# Patient Record
Sex: Male | Born: 2000 | Race: White | Hispanic: No | Marital: Single | Smoking: Never smoker
Health system: Southern US, Community
[De-identification: ages and names within clinical notes are randomized; demographics above are authoritative.]

## PROBLEM LIST (undated history)

## (undated) DIAGNOSIS — K589 Irritable bowel syndrome without diarrhea: Secondary | ICD-10-CM

## (undated) DIAGNOSIS — G43909 Migraine, unspecified, not intractable, without status migrainosus: Secondary | ICD-10-CM

---
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
## 2019-11-27 ENCOUNTER — Other Ambulatory Visit: Payer: Self-pay

## 2019-11-27 ENCOUNTER — Ambulatory Visit: Payer: Self-pay

## 2019-11-27 ENCOUNTER — Other Ambulatory Visit: Payer: Self-pay | Admitting: Occupational Medicine

## 2019-11-27 DIAGNOSIS — M79644 Pain in right finger(s): Secondary | ICD-10-CM

## 2020-07-02 ENCOUNTER — Ambulatory Visit
Admission: EM | Admit: 2020-07-02 | Discharge: 2020-07-02 | Disposition: A | Discharge status: Home / Self Care | Payer: Medicaid Other | Attending: Emergency Medicine | Admitting: Emergency Medicine

## 2020-07-02 ENCOUNTER — Other Ambulatory Visit: Payer: Self-pay

## 2020-07-02 DIAGNOSIS — Z1152 Encounter for screening for COVID-19: Secondary | ICD-10-CM

## 2020-07-03 LAB — NOVEL CORONAVIRUS, NAA: SARS-CoV-2, NAA: NOT DETECTED

## 2020-07-12 ENCOUNTER — Encounter: Payer: Self-pay | Admitting: Emergency Medicine

## 2020-07-12 ENCOUNTER — Other Ambulatory Visit: Payer: Self-pay

## 2020-07-12 ENCOUNTER — Ambulatory Visit
Admission: EM | Admit: 2020-07-12 | Discharge: 2020-07-12 | Disposition: A | Discharge status: Home / Self Care | Payer: Medicaid Other | Attending: Emergency Medicine | Admitting: Emergency Medicine

## 2020-07-12 DIAGNOSIS — R05 Cough: Secondary | ICD-10-CM | POA: Diagnosis not present

## 2020-07-12 DIAGNOSIS — Z20822 Contact with and (suspected) exposure to covid-19: Secondary | ICD-10-CM

## 2020-07-12 DIAGNOSIS — R059 Cough, unspecified: Secondary | ICD-10-CM

## 2020-07-12 DIAGNOSIS — J069 Acute upper respiratory infection, unspecified: Secondary | ICD-10-CM

## 2020-07-12 MED ORDER — ONDANSETRON HCL 4 MG PO TABS: 4.0000 mg | ORAL_TABLET | Freq: Four times a day (QID) | ORAL | 0 refills | Status: DC

## 2020-07-12 MED ORDER — BENZONATATE 100 MG PO CAPS: 100.0000 mg | ORAL_CAPSULE | Freq: Three times a day (TID) | ORAL | 0 refills | Status: DC

## 2020-07-12 NOTE — ED Triage Notes (Signed)
Headache, woke up sweating, cough, nausea for past few days

## 2020-07-12 NOTE — ED Provider Notes (Signed)
[facility]   [id number] 07/12/20 Arrival Time: 1342   CC: COVID symptoms  SUBJECTIVE: History from: patient.  Jeremy Robertson is a 19 y.o. male who presents with headache, sweating, cough, diarrhea, and nausea x few days.  GF's brother tested positive for COVID.  Has tried OTC medications without relief.  Symptoms are made worse with eating.  Denies previous COVID infection in the past.   Denies fever, sore throat, SOB, wheezing, chest pain, vomiting, changes in bladder habits.    ROS: As per HPI.  All other pertinent ROS negative.     History reviewed. No pertinent past medical history. History reviewed. No pertinent surgical history. No Known Allergies No current facility-administered medications on file prior to encounter.   Current Outpatient Medications on File Prior to Encounter  Medication Sig Dispense Refill  . ibuprofen (ADVIL,MOTRIN) 600 MG tablet Take 1 tablet (600 mg total) by mouth every 8 (eight) hours as needed for moderate pain. 20 tablet 0   Social History   Socioeconomic History  . Marital status: Single    Spouse name: Not on file  . Number of children: Not on file  . Years of education: Not on file  . Highest education level: Not on file  Occupational History  . Not on file  Tobacco Use  . Smoking status: Never Smoker  . Smokeless tobacco: Never Used  Substance and Sexual Activity  . Alcohol use: No  . Drug use: No  . Sexual activity: Not on file  Other Topics Concern  . Not on file  Social History Narrative  . Not on file   Social Determinants of Health   Financial Resource Strain:   . Difficulty of Paying Living Expenses: Not on file  Food Insecurity:   . Worried About Programme researcher, broadcasting/film/video in the Last Year: Not on file  . Ran Out of Food in the Last Year: Not on file  Transportation Needs:   . Lack of Transportation (Medical): Not on file  . Lack of Transportation (Non-Medical): Not on file  Physical Activity:   . Days of  Exercise per Week: Not on file  . Minutes of Exercise per Session: Not on file  Stress:   . Feeling of Stress : Not on file  Social Connections:   . Frequency of Communication with Friends and Family: Not on file  . Frequency of Social Gatherings with Friends and Family: Not on file  . Attends Religious Services: Not on file  . Active Member of Clubs or Organizations: Not on file  . Attends Banker Meetings: Not on file  . Marital Status: Not on file  Intimate Partner Violence:   . Fear of Current or Ex-Partner: Not on file  . Emotionally Abused: Not on file  . Physically Abused: Not on file  . Sexually Abused: Not on file   No family history on file.  OBJECTIVE:  Vitals:   07/12/20 1419 07/12/20 1420  BP: 115/77   Pulse: 70   Resp: 18   Temp: 98.9 F (37.2 C)   TempSrc: Oral   SpO2: 97%   Weight:  145 lb (65.8 kg)  Height:  5\' 10"  (1.778 m)    General appearance: alert; appears fatigued, but nontoxic; speaking in full sentences and tolerating own secretions HEENT: NCAT; Ears: EACs clear, TMs pearly gray; Eyes: PERRL.  EOM grossly intact. Nose: nares patent without rhinorrhea, Throat: oropharynx clear, tonsils non erythematous or enlarged, uvula midline  Neck: supple without LAD  Lungs: unlabored respirations, symmetrical air entry; cough: absent; no respiratory distress; CTAB Heart: regular rate and rhythm.   Abdomen: soft, nondistended, normal active bowel sounds; nontender to palpation; no guarding  Skin: warm and dry Psychological: alert and cooperative; normal mood and affect  ASSESSMENT & PLAN:  1. Cough   2. Viral URI with cough   3. Suspected COVID-19 virus infection     Meds ordered this encounter  Medications  . benzonatate (TESSALON) 100 MG capsule    Sig: Take 1 capsule (100 mg total) by mouth every 8 (eight) hours.    Dispense:  21 capsule    Refill:  0    Order Specific Question:   Supervising Provider    Answer:   Eustace Moore  [[id number]]   COVID testing ordered.  It will take between 5-7 days for test results.  Someone will contact you regarding abnormal results.    In the meantime: You should remain isolated in your home for 10 days from symptom onset AND greater than 72 hours after symptoms resolution (absence of fever without the use of fever-reducing medication and improvement in respiratory symptoms), whichever is longer Get plenty of rest and push fluids Tessalon Perles prescribed for cough Use OTC zyrtec for nasal congestion, runny nose, and/or sore throat Use OTC flonase for nasal congestion and runny nose Use medications daily for symptom relief Use OTC medications like ibuprofen or tylenol as needed fever or pain Call or go to the ED if you have any new or worsening symptoms such as fever, worsening cough, shortness of breath, chest tightness, chest pain, turning blue, changes in mental status, etc...   Reviewed expectations re: course of current medical issues. Questions answered. Outlined signs and symptoms indicating need for more acute intervention. Patient verbalized understanding. After Visit Summary given.         Rennis Harding, PA-C 07/12/20 1457

## 2020-07-12 NOTE — Discharge Instructions (Signed)

## 2020-07-14 LAB — NOVEL CORONAVIRUS, NAA: SARS-CoV-2, NAA: DETECTED — AB

## 2020-07-14 LAB — SARS-COV-2, NAA 2 DAY TAT

## 2020-10-06 ENCOUNTER — Other Ambulatory Visit: Payer: Self-pay

## 2020-10-06 ENCOUNTER — Ambulatory Visit
Admission: EM | Admit: 2020-10-06 | Discharge: 2020-10-06 | Disposition: A | Discharge status: Home / Self Care | Payer: BLUE CROSS/BLUE SHIELD | Attending: Emergency Medicine | Admitting: Emergency Medicine

## 2020-10-06 DIAGNOSIS — J069 Acute upper respiratory infection, unspecified: Secondary | ICD-10-CM | POA: Insufficient documentation

## 2020-10-06 DIAGNOSIS — Z1152 Encounter for screening for COVID-19: Secondary | ICD-10-CM | POA: Diagnosis not present

## 2020-10-06 DIAGNOSIS — R509 Fever, unspecified: Secondary | ICD-10-CM | POA: Insufficient documentation

## 2020-10-06 DIAGNOSIS — J029 Acute pharyngitis, unspecified: Secondary | ICD-10-CM | POA: Diagnosis not present

## 2020-10-06 LAB — POCT RAPID STREP A (OFFICE): Rapid Strep A Screen: NEGATIVE

## 2020-10-06 MED ORDER — FLUTICASONE PROPIONATE 50 MCG/ACT NA SUSP: 1.0000 | Freq: Every day | NASAL | 0 refills | Status: AC

## 2020-10-06 MED ORDER — DEXAMETHASONE 4 MG PO TABS: 4.0000 mg | ORAL_TABLET | Freq: Every day | ORAL | 0 refills | Status: AC

## 2020-10-06 MED ORDER — BENZONATATE 100 MG PO CAPS: 100.0000 mg | ORAL_CAPSULE | Freq: Three times a day (TID) | ORAL | 0 refills | Status: DC | PRN

## 2020-10-06 MED ORDER — CETIRIZINE HCL 10 MG PO TABS: 10.0000 mg | ORAL_TABLET | Freq: Every day | ORAL | 0 refills | Status: DC

## 2020-10-06 NOTE — ED Triage Notes (Signed)
Pt presents with c/o fever , cough  And sore throat that began yesterday

## 2020-10-06 NOTE — Discharge Instructions (Addendum)
Strep test is negative.  Sample will be sent for culture and someone will call if your result is positive.  COVID-19, flu A/B, RSV testing ordered.  It will take between 2-7 days for test results.  Someone will contact you regarding abnormal results.    In the meantime: You should remain isolated in your home for 10 days from symptom onset AND greater than 24 hours after symptoms resolution (absence of fever without the use of fever-reducing medication and improvement in respiratory symptoms), whichever is longer Get plenty of rest and push fluids Tessalon Perles prescribed for cough Zyrtec for nasal congestion, runny nose, and/or sore throat Flonase for nasal congestion and runny nose Decadron was prescribed Use OTC throat lozenges such as Halls, Cepacol or Vicks to soothe throat Use medications daily for symptom relief Use OTC medications like ibuprofen or tylenol as needed fever or pain Call or go to the ED if you have any new or worsening symptoms such as fever, worsening cough, shortness of breath, chest tightness, chest pain, turning blue, changes in mental status, etc..Marland Kitchen

## 2020-10-06 NOTE — ED Provider Notes (Addendum)
[facility]   [id number] 10/06/20 Arrival Time: 1002   Chief Complaint  Patient presents with  . Sore Throat  . Fever  . Cough     SUBJECTIVE: History from: patient.  Aven Christen Zaugg is a 19 y.o. male who who presented to the urgent care for complaint of chills, fever, cough and sore throat that started yesterday.  Denies sick exposure to COVID, flu or strep.  Denies recent travel.  Has tried OTC medication without relief.  Denies alleviating or aggravating factors.  Denies previous symptoms in the past.   Denies fever, chills, fatigue, sinus pain, rhinorrhea, change in bowel and bladder habits.     ROS: As per HPI.  All other pertinent ROS negative.      History reviewed. No pertinent past medical history. History reviewed. No pertinent surgical history. No Known Allergies No current facility-administered medications on file prior to encounter.   No current outpatient medications on file prior to encounter.   Social History   Socioeconomic History  . Marital status: Single    Spouse name: Not on file  . Number of children: Not on file  . Years of education: Not on file  . Highest education level: Not on file  Occupational History  . Not on file  Tobacco Use  . Smoking status: Never Smoker  . Smokeless tobacco: Never Used  Vaping Use  . Vaping Use: Every day  Substance and Sexual Activity  . Alcohol use: Not Currently  . Drug use: Not Currently  . Sexual activity: Not on file  Other Topics Concern  . Not on file  Social History Narrative  . Not on file   Social Determinants of Health   Financial Resource Strain:   . Difficulty of Paying Living Expenses: Not on file  Food Insecurity:   . Worried About Programme researcher, broadcasting/film/video in the Last Year: Not on file  . Ran Out of Food in the Last Year: Not on file  Transportation Needs:   . Lack of Transportation (Medical): Not on file  . Lack of Transportation (Non-Medical): Not on file  Physical Activity:    . Days of Exercise per Week: Not on file  . Minutes of Exercise per Session: Not on file  Stress:   . Feeling of Stress : Not on file  Social Connections:   . Frequency of Communication with Friends and Family: Not on file  . Frequency of Social Gatherings with Friends and Family: Not on file  . Attends Religious Services: Not on file  . Active Member of Clubs or Organizations: Not on file  . Attends Banker Meetings: Not on file  . Marital Status: Not on file  Intimate Partner Violence:   . Fear of Current or Ex-Partner: Not on file  . Emotionally Abused: Not on file  . Physically Abused: Not on file  . Sexually Abused: Not on file   History reviewed. No pertinent family history.  OBJECTIVE:  Vitals:   10/06/20 1027  BP: 117/74  Pulse: 78  Resp: 18  Temp: 98.4 F (36.9 C)  SpO2: 99%     General appearance: alert; appears fatigued, but nontoxic; speaking in full sentences and tolerating own secretions HEENT: NCAT; Ears: EACs clear, TMs pearly gray; Eyes: PERRL.  EOM grossly intact. Sinuses: nontender; Nose: nares patent without rhinorrhea, Throat: oropharynx clear, tonsils non erythematous or enlarged, uvula midline  Neck: supple without LAD Lungs: unlabored respirations, symmetrical air entry; cough: moderate; no respiratory distress;  CTAB Heart: regular rate and rhythm.  Radial pulses 2+ symmetrical bilaterally Skin: warm and dry Psychological: alert and cooperative; normal mood and affect  LABS:  Results for orders placed or performed during the hospital encounter of 10/06/20 (from the past 24 hour(s))  POCT rapid strep A     Status: None   Collection Time: 10/06/20 10:49 AM  Result Value Ref Range   Rapid Strep A Screen Negative Negative     ASSESSMENT & PLAN:  1. Encounter for screening for COVID-19   2. Sore throat   3. URI with cough and congestion   4. Chills with fever     Meds ordered this encounter  Medications  . fluticasone  (FLONASE) 50 MCG/ACT nasal spray    Sig: Place 1 spray into both nostrils daily for 14 days.    Dispense:  16 g    Refill:  0  . cetirizine (ZYRTEC ALLERGY) 10 MG tablet    Sig: Take 1 tablet (10 mg total) by mouth daily.    Dispense:  30 tablet    Refill:  0  . dexamethasone (DECADRON) 4 MG tablet    Sig: Take 1 tablet (4 mg total) by mouth daily for 7 days.    Dispense:  7 tablet    Refill:  0  . benzonatate (TESSALON) 100 MG capsule    Sig: Take 1 capsule (100 mg total) by mouth 3 (three) times daily as needed for cough.    Dispense:  30 capsule    Refill:  0    Discharge instructions  Strep test is negative.  Sample will be sent for culture and someone will call if your result is positive.  COVID-19, flu A/B, RSV testing ordered.  It will take between 2-7 days for test results.  Someone will contact you regarding abnormal results.    In the meantime: You should remain isolated in your home for 10 days from symptom onset AND greater than 24 hours after symptoms resolution (absence of fever without the use of fever-reducing medication and improvement in respiratory symptoms), whichever is longer Get plenty of rest and push fluids Tessalon Perles prescribed for cough Zyrtec for nasal congestion, runny nose, and/or sore throat Flonase for nasal congestion and runny nose Decadron was prescribed Use OTC throat lozenges such as Halls, Cepacol or Vicks to soothe throat Use medications daily for symptom relief Use OTC medications like ibuprofen or tylenol as needed fever or pain Call or go to the ED if you have any new or worsening symptoms such as fever, worsening cough, shortness of breath, chest tightness, chest pain, turning blue, changes in mental status, etc...   Reviewed expectations re: course of current medical issues. Questions answered. Outlined signs and symptoms indicating need for more acute intervention. Patient verbalized understanding. After Visit Summary  given.         Durward Parcel, FNP 10/06/20 1053    Durward Parcel, FNP 10/06/20 1055

## 2020-10-08 LAB — COVID-19, FLU A+B AND RSV
Influenza A, NAA: DETECTED — AB
Influenza B, NAA: NOT DETECTED
RSV, NAA: NOT DETECTED
SARS-CoV-2, NAA: NOT DETECTED

## 2020-10-12 LAB — CULTURE, GROUP A STREP (THRC)

## 2021-06-28 IMAGING — DX DG FINGER THUMB 2+V*R*
3 series · 3 of 3 positions shown · non-contrast
Comparison: None.

CLINICAL DATA: Right thumb crush injury, initial encounter

EXAM:
RIGHT THUMB 2+V

[finger pa]
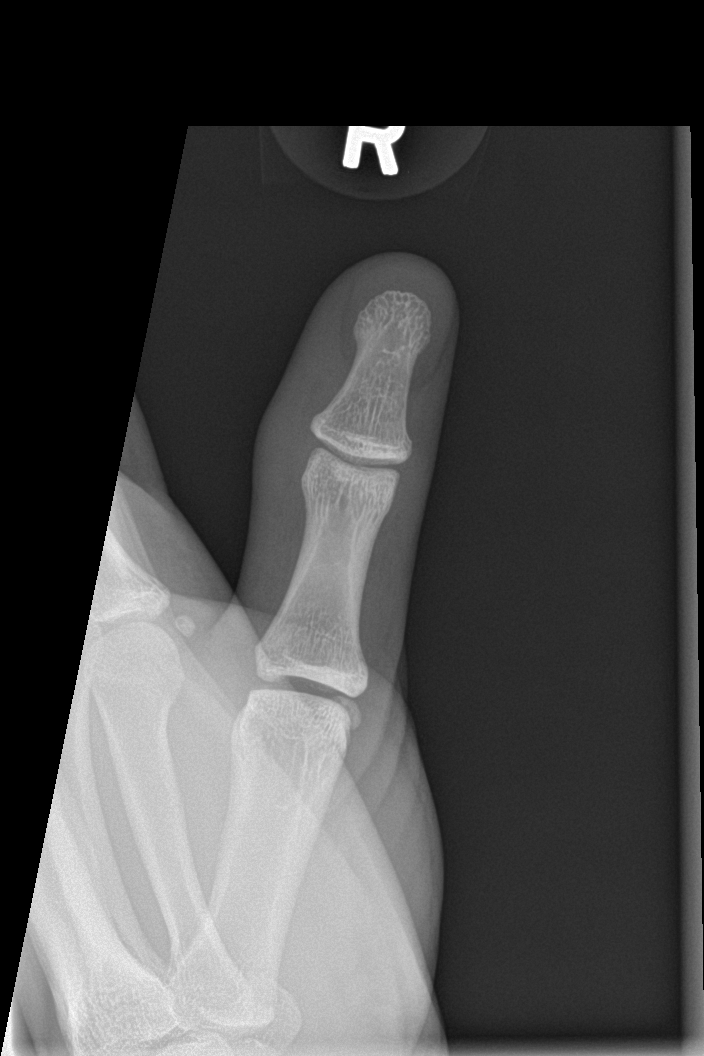

[finger obl]
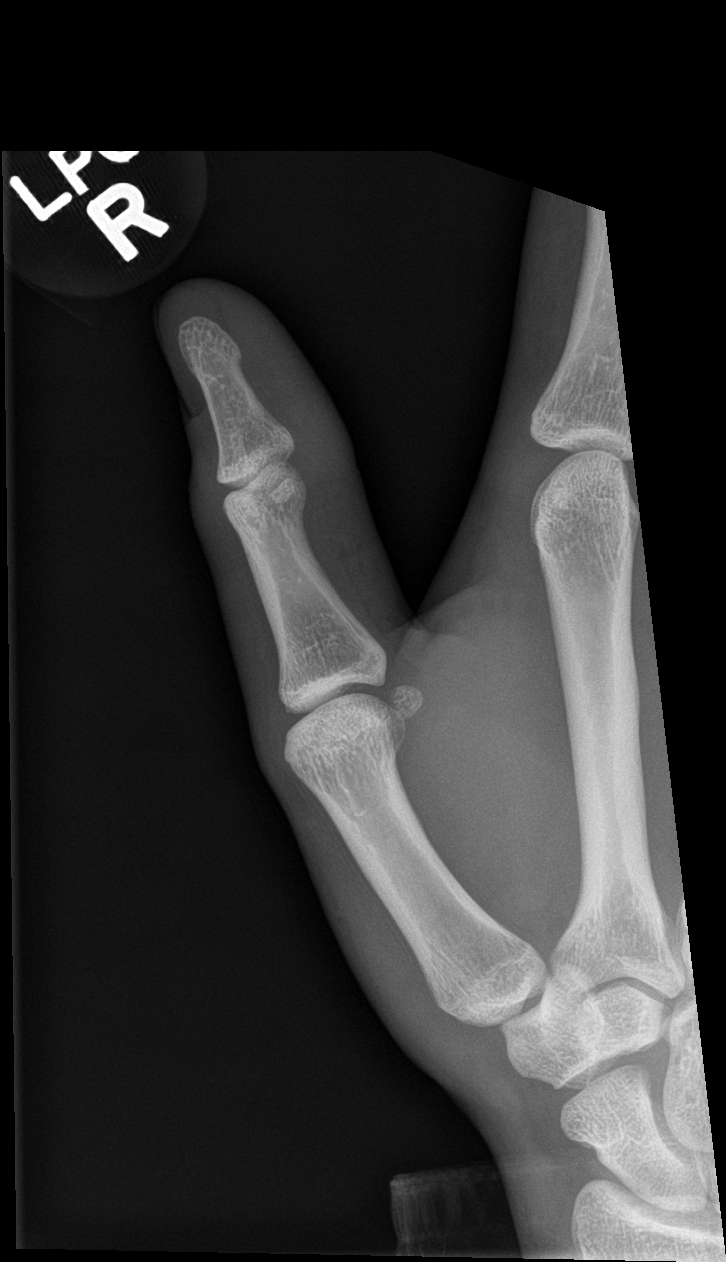

[finger lat]
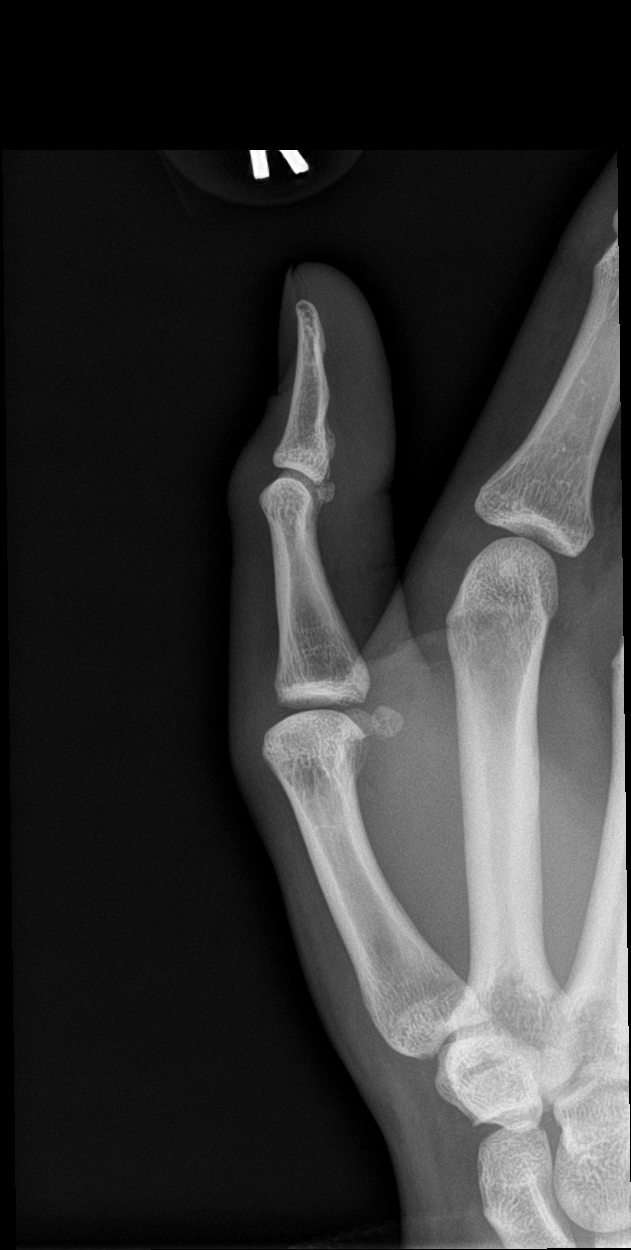

[3 of 3 positions shown; findings below may reference images not displayed]

FINDINGS: There is a tiny linear density identified overlying the first MCP
joint which may represent a tiny avulsion fracture although felt to
be less likely given its location within the joint. No other
fracture is seen. No soft tissue abnormality is noted.
IMPRESSION: Linear density within the first MCP joint as described. This could
represent a small avulsion. Follow-up can be performed as clinically
indicated.

## 2021-10-12 ENCOUNTER — Other Ambulatory Visit (HOSPITAL_COMMUNITY): Payer: Self-pay | Admitting: Family Medicine

## 2021-10-12 ENCOUNTER — Other Ambulatory Visit: Payer: Self-pay | Admitting: Family Medicine

## 2021-10-12 DIAGNOSIS — G43101 Migraine with aura, not intractable, with status migrainosus: Secondary | ICD-10-CM

## 2021-10-21 ENCOUNTER — Ambulatory Visit (HOSPITAL_COMMUNITY): Payer: BC Managed Care – PPO

## 2021-11-01 ENCOUNTER — Encounter (HOSPITAL_COMMUNITY): Payer: Self-pay

## 2021-11-01 ENCOUNTER — Ambulatory Visit (HOSPITAL_COMMUNITY): Payer: BC Managed Care – PPO

## 2022-08-14 ENCOUNTER — Ambulatory Visit
Admission: RE | Admit: 2022-08-14 | Discharge: 2022-08-14 | Disposition: A | Discharge status: Home / Self Care | Payer: Commercial Managed Care - PPO | Source: Ambulatory Visit | Attending: Nurse Practitioner | Admitting: Nurse Practitioner

## 2022-08-14 VITALS — BP 136/96 | HR 87 | Temp 98.1°F | Resp 18

## 2022-08-14 DIAGNOSIS — H66001 Acute suppurative otitis media without spontaneous rupture of ear drum, right ear: Secondary | ICD-10-CM | POA: Diagnosis not present

## 2022-08-14 DIAGNOSIS — J069 Acute upper respiratory infection, unspecified: Secondary | ICD-10-CM | POA: Diagnosis not present

## 2022-08-14 DIAGNOSIS — Z1152 Encounter for screening for COVID-19: Secondary | ICD-10-CM | POA: Insufficient documentation

## 2022-08-14 HISTORY — DX: Migraine, unspecified, not intractable, without status migrainosus: G43.909

## 2022-08-14 HISTORY — DX: Irritable bowel syndrome, unspecified: K58.9

## 2022-08-14 LAB — RESP PANEL BY RT-PCR (FLU A&B, COVID) ARPGX2
Influenza A by PCR: NEGATIVE
Influenza B by PCR: NEGATIVE
SARS Coronavirus 2 by RT PCR: NEGATIVE

## 2022-08-14 MED ORDER — BENZONATATE 100 MG PO CAPS: 100.0000 mg | ORAL_CAPSULE | Freq: Three times a day (TID) | ORAL | 0 refills | Status: DC | PRN

## 2022-08-14 MED ORDER — AMOXICILLIN 875 MG PO TABS: 875.0000 mg | ORAL_TABLET | Freq: Two times a day (BID) | ORAL | 0 refills | Status: AC

## 2022-08-14 NOTE — ED Triage Notes (Signed)
Pt states that he has had a sore throat x 2 days. He has started with a cough and nasal congestion. He feels tired and his ear are stuffy. He complains of fever but didn't take his temp. He took decongestant yesterday and it helped his throat a alittle.

## 2022-08-14 NOTE — ED Provider Notes (Signed)
[facility]    CSN: [id number] Arrival date & time: 08/14/22  7793      History   Chief Complaint Chief Complaint  Patient presents with   Influenza    Fever, runny nose, swelt throat, blurry vision, fatigue - Entered by patient   Fever   Sore Throat   Nasal Congestion    HPI Jeremy Robertson is a 21 y.o. male.   Patient presents for 2 days of tactile fevers, sweats and cold chills, dry cough that is productive at times, chest pain when coughing, nasal congestion, runny nose, postnasal drainage, sore throat, sinus pressure, fatigue, and right ear pressure with muffled hearing.  He denies shortness of breath or wheezing, chest tightness, chest congestion, sneezing, headache or tooth pain, ear pain, abdominal pain, nausea/vomiting, diarrhea, decreased appetite, loss of taste or smell, rash, and ear drainage.  He has increased water intake drastically to help flush out infection as well as tried Mucinex without much relief.  Reports his mom had a similar "bug" 3 to 4 days ago.     Past Medical History:  Diagnosis Date   IBS (irritable bowel syndrome)    Migraines     There are no problems to display for this patient.   History reviewed. No pertinent surgical history.     Home Medications    Prior to Admission medications   Medication Sig Start Date End Date Taking? Authorizing Provider  amoxicillin (AMOXIL) 875 MG tablet Take 1 tablet (875 mg total) by mouth 2 (two) times daily for 5 days. 08/14/22 08/19/22 Yes Valentino Nose, NP  benzonatate (TESSALON) 100 MG capsule Take 1 capsule (100 mg total) by mouth 3 (three) times daily as needed for cough. Do not take with alcohol or while driving or operating heavy machinery.  May cause drowsiness. 08/14/22  Yes Valentino Nose, NP  dicyclomine (BENTYL) 10 MG capsule Take 10 mg by mouth 4 (four) times daily -  before meals and at bedtime.   Yes [provider]  topiramate (TOPAMAX) 25 MG tablet  Take 25 mg by mouth 2 (two) times daily. 02/24/22  Yes [provider]  cetirizine (ZYRTEC ALLERGY) 10 MG tablet Take 1 tablet (10 mg total) by mouth daily. 10/06/20   Avegno, Zachery Dakins, FNP  fluticasone (FLONASE) 50 MCG/ACT nasal spray Place 1 spray into both nostrils daily for 14 days. 10/06/20 10/20/20  Avegno, Zachery Dakins, FNP  ibuprofen (ADVIL,MOTRIN) 600 MG tablet Take 1 tablet (600 mg total) by mouth every 8 (eight) hours as needed for moderate pain. 09/16/15   Burgess Amor, PA-C  ondansetron (ZOFRAN) 4 MG tablet Take 1 tablet (4 mg total) by mouth every 6 (six) hours. 07/12/20   Rennis Harding, PA-C    Family History History reviewed. No pertinent family history.  Social History Social History   Tobacco Use   Smoking status: Never   Smokeless tobacco: Never  Vaping Use   Vaping Use: Every day  Substance Use Topics   Alcohol use: Yes    Comment: rare   Drug use: No     Allergies   Patient has no known allergies.   Review of Systems Review of Systems Per HPI  Physical Exam Triage Vital Signs ED Triage Vitals  Enc Vitals Group     BP 08/14/22 1007 (!) 136/96     Pulse Rate 08/14/22 1007 87     Resp 08/14/22 1007 18     Temp 08/14/22 1007 98.1 F (36.7 C)  Temp Source 08/14/22 1007 Oral     SpO2 08/14/22 1007 96 %     Weight --      Height --      Head Circumference --      Peak Flow --      Pain Score 08/14/22 1003 4     Pain Loc --      Pain Edu? --      Excl. in GC? --    No data found.  Updated Vital Signs BP (!) 136/96 (BP Location: Right Arm)   Pulse 87   Temp 98.1 F (36.7 C) (Oral)   Resp 18   SpO2 96%   Visual Acuity Right Eye Distance:   Left Eye Distance:   Bilateral Distance:    Right Eye Near:   Left Eye Near:    Bilateral Near:     Physical Exam Vitals and nursing note reviewed.  Constitutional:      General: He is not in acute distress.    Appearance: Normal appearance. He is not ill-appearing or toxic-appearing.   HENT:     Head: Normocephalic and atraumatic.     Right Ear: Ear canal and external ear normal. No drainage, swelling or tenderness. A middle ear effusion is present. Tympanic membrane is erythematous.     Left Ear: Tympanic membrane, ear canal and external ear normal. No drainage, swelling or tenderness.  No middle ear effusion. Tympanic membrane is not erythematous.     Nose: Congestion and rhinorrhea present.     Mouth/Throat:     Mouth: Mucous membranes are moist.     Pharynx: Oropharynx is clear. Posterior oropharyngeal erythema present. No oropharyngeal exudate.     Tonsils: No tonsillar exudate. 0 on the right. 0 on the left.     Comments: Cobblestoning of posterior pharynx Eyes:     General: No scleral icterus.    Extraocular Movements: Extraocular movements intact.  Cardiovascular:     Rate and Rhythm: Normal rate and regular rhythm.  Pulmonary:     Effort: Pulmonary effort is normal. No respiratory distress.     Breath sounds: Normal breath sounds. No wheezing, rhonchi or rales.  Abdominal:     General: Abdomen is flat. Bowel sounds are normal. There is no distension.     Palpations: Abdomen is soft.  Musculoskeletal:     Cervical back: Normal range of motion and neck supple.  Lymphadenopathy:     Cervical: No cervical adenopathy.  Skin:    General: Skin is warm and dry.     Coloration: Skin is not jaundiced or pale.     Findings: No erythema or rash.  Neurological:     Mental Status: He is alert and oriented to person, place, and time.     Motor: No weakness.  Psychiatric:        Behavior: Behavior is cooperative.      UC Treatments / Results  Labs (all labs ordered are listed, but only abnormal results are displayed) Labs Reviewed  RESP PANEL BY RT-PCR (FLU A&B, COVID) [id number]    EKG   Radiology No results found.  Procedures Procedures (including critical care time)  Medications Ordered in UC Medications - No data to display  Initial Impression /  Assessment and Plan / UC Course  I have reviewed the triage vital signs and the nursing notes.  Pertinent labs & imaging results that were available during my care of the patient were reviewed by me and considered in my medical  decision making (see chart for details).    Patient is well-appearing, normotensive, afebrile, not tachycardic, not tachypneic, oxygenating well on room air.    Encounter for screening for COVID-19 Viral URI with cough COVID-19, influenza testing obtained Supportive care discussed Start cough suppressant ER and return precautions discussed  Note given for work  Acute suppurative otitis media of right ear Treat with amoxicillin 875 mg twice daily for 5 days  The patient was given the opportunity to ask questions.  All questions answered to their satisfaction.  The patient is in agreement to this plan.     Final Clinical Impressions(s) / UC Diagnoses   Final diagnoses:  Encounter for screening for COVID-19  Viral URI with cough  Acute suppurative otitis media of right ear     Discharge Instructions      You have an ear infection, please take the amoxicillin (antibiotic) as prescribed for this.  We have tested you today for COVID-19 and influenza.  You will see the results in Mychart and we will call you with positive results.    Please stay home and isolate until you are aware of the results.    Some things that can make you feel better are: - Increased rest - Increasing fluid with water/sugar free electrolytes - Acetaminophen and ibuprofen as needed for fever/pain.  - Salt water gargling, chloraseptic spray and throat lozenges - OTC guaifenesin (Mucinex).  - Saline sinus flushes or a neti pot.  - Humidifying the air. - Tessalon perles as needed for cough.     ED Prescriptions     Medication Sig Dispense Auth. Provider   benzonatate (TESSALON) 100 MG capsule Take 1 capsule (100 mg total) by mouth 3 (three) times daily as needed for cough. Do  not take with alcohol or while driving or operating heavy machinery.  May cause drowsiness. 21 capsule Noemi Chapel A, NP   amoxicillin (AMOXIL) 875 MG tablet Take 1 tablet (875 mg total) by mouth 2 (two) times daily for 5 days. 10 tablet Eulogio Bear, NP      PDMP not reviewed this encounter.   Eulogio Bear, NP 08/14/22 1036

## 2022-08-14 NOTE — Discharge Instructions (Addendum)
You have an ear infection, please take the amoxicillin (antibiotic) as prescribed for this.  We have tested you today for COVID-19 and influenza.  You will see the results in Mychart and we will call you with positive results.    Please stay home and isolate until you are aware of the results.    Some things that can make you feel better are: - Increased rest - Increasing fluid with water/sugar free electrolytes - Acetaminophen and ibuprofen as needed for fever/pain.  - Salt water gargling, chloraseptic spray and throat lozenges - OTC guaifenesin (Mucinex).  - Saline sinus flushes or a neti pot.  - Humidifying the air. - Tessalon perles as needed for cough.

## 2023-01-09 ENCOUNTER — Other Ambulatory Visit: Payer: Self-pay

## 2023-01-09 ENCOUNTER — Ambulatory Visit
Admission: EM | Admit: 2023-01-09 | Discharge: 2023-01-09 | Disposition: A | Discharge status: Home / Self Care | Payer: Self-pay | Attending: Nurse Practitioner | Admitting: Nurse Practitioner

## 2023-01-09 ENCOUNTER — Encounter: Payer: Self-pay | Admitting: Emergency Medicine

## 2023-01-09 DIAGNOSIS — J019 Acute sinusitis, unspecified: Secondary | ICD-10-CM

## 2023-01-09 DIAGNOSIS — B9689 Other specified bacterial agents as the cause of diseases classified elsewhere: Secondary | ICD-10-CM

## 2023-01-09 MED ORDER — AMOXICILLIN-POT CLAVULANATE 875-125 MG PO TABS: 1.0000 | ORAL_TABLET | Freq: Two times a day (BID) | ORAL | 0 refills | Status: AC

## 2023-01-09 NOTE — ED Provider Notes (Signed)
[facility]    CSN: [id number] Arrival date & time: 01/09/23  1319      History   Chief Complaint Chief Complaint  Patient presents with   Fever    HPI Jeremy Robertson is a 22 y.o. male.   Patient presents today with 1 week history of congestion.  Reports fever, chills, headache began last night.  Also having sore throat, body aches, nasal congestion, runny nose, nausea, decreased appetite, and fatigue.  Patient denies cough, congested cough, dry cough, shortness of breath, chest pain with coughing, wheezing, chest tightness, chest congestion, post nasal drainage, sinus pressure, ear pain, abdominal pain, vomiting, diarrhea, loss of taste, loss of smell, rash, and known sick contacts. Has taken migraine medication (Topamax) and sinus medicine for symptoms with some benefit.  Reports co-workers have been sick with similar symptoms recently.  Patient denies antibiotic use in the past 90 days as well as allergies to antibiotic therapy.    Past Medical History:  Diagnosis Date   IBS (irritable bowel syndrome)    Migraines     There are no problems to display for this patient.   History reviewed. No pertinent surgical history.     Home Medications    Prior to Admission medications   Medication Sig Start Date End Date Taking? Authorizing Provider  amoxicillin-clavulanate (AUGMENTIN) 875-125 MG tablet Take 1 tablet by mouth 2 (two) times daily for 7 days. 01/09/23 01/16/23 Yes Eulogio Bear, NP  dicyclomine (BENTYL) 10 MG capsule Take 10 mg by mouth 4 (four) times daily -  before meals and at bedtime.    [provider]  fluticasone (FLONASE) 50 MCG/ACT nasal spray Place 1 spray into both nostrils daily for 14 days. 10/06/20 10/20/20  Avegno, Darrelyn Hillock, FNP  topiramate (TOPAMAX) 25 MG tablet Take 25 mg by mouth 2 (two) times daily. 02/24/22   [provider]    Family History History reviewed. No pertinent family history.  Social  History Social History   Tobacco Use   Smoking status: Never   Smokeless tobacco: Never  Vaping Use   Vaping Use: Every day  Substance Use Topics   Alcohol use: Yes    Comment: rare   Drug use: No     Allergies   Patient has no known allergies.   Review of Systems Review of Systems Per HPI  Physical Exam Triage Vital Signs ED Triage Vitals  Enc Vitals Group     BP 01/09/23 1452 121/79     Pulse Rate 01/09/23 1452 (!) 58     Resp 01/09/23 1452 20     Temp 01/09/23 1452 98 F (36.7 C)     Temp Source 01/09/23 1452 Oral     SpO2 01/09/23 1452 97 %     Weight --      Height --      Head Circumference --      Peak Flow --      Pain Score 01/09/23 1449 5     Pain Loc --      Pain Edu? --      Excl. in [place]? --    No data found.  Updated Vital Signs BP 121/79 (BP Location: Right Arm)   Pulse (!) 58   Temp 98 F (36.7 C) (Oral)   Resp 20   SpO2 97%   Visual Acuity Right Eye Distance:   Left Eye Distance:   Bilateral Distance:    Right Eye Near:   Left Eye  Near:    Bilateral Near:     Physical Exam Vitals and nursing note reviewed.  Constitutional:      General: He is not in acute distress.    Appearance: Normal appearance. He is not ill-appearing or toxic-appearing.  HENT:     Head: Normocephalic and atraumatic.     Right Ear: Tympanic membrane, ear canal and external ear normal.     Left Ear: Tympanic membrane, ear canal and external ear normal.     Nose: Congestion present. No rhinorrhea.     Right Sinus: Frontal sinus tenderness present. No maxillary sinus tenderness.     Left Sinus: Frontal sinus tenderness present. No maxillary sinus tenderness.     Mouth/Throat:     Mouth: Mucous membranes are moist.     Pharynx: Oropharynx is clear. No oropharyngeal exudate or posterior oropharyngeal erythema.  Eyes:     General: No scleral icterus.    Extraocular Movements: Extraocular movements intact.  Cardiovascular:     Rate and Rhythm: Normal rate  and regular rhythm.  Pulmonary:     Effort: Pulmonary effort is normal. No respiratory distress.     Breath sounds: Normal breath sounds. No wheezing, rhonchi or rales.  Abdominal:     General: Abdomen is flat. Bowel sounds are normal. There is no distension.     Palpations: Abdomen is soft.     Tenderness: There is no abdominal tenderness. There is no guarding.  Musculoskeletal:     Cervical back: Normal range of motion and neck supple.  Lymphadenopathy:     Cervical: No cervical adenopathy.  Skin:    General: Skin is warm and dry.     Coloration: Skin is not jaundiced or pale.     Findings: No erythema or rash.  Neurological:     Mental Status: He is alert and oriented to person, place, and time.  Psychiatric:        Behavior: Behavior is cooperative.      UC Treatments / Results  Labs (all labs ordered are listed, but only abnormal results are displayed) Labs Reviewed - No data to display  EKG   Radiology No results found.  Procedures Procedures (including critical care time)  Medications Ordered in UC Medications - No data to display  Initial Impression / Assessment and Plan / UC Course  I have reviewed the triage vital signs and the nursing notes.  Pertinent labs & imaging results that were available during my care of the patient were reviewed by me and considered in my medical decision making (see chart for details).   Patient is well-appearing, normotensive, afebrile, not tachycardic, not tachypneic, oxygenating well on room air.    1. Acute bacterial sinusitis Treat with Augmentin twice daily for 7 days Supportive care discussed Continue Mucinex 600 mg twice daily Start nasal saline rinses/lavages ER and return precautions discussed with patient Note given for work  The patient was given the opportunity to ask questions.  All questions answered to their satisfaction.  The patient is in agreement to this plan.   Final Clinical Impressions(s) / UC  Diagnoses   Final diagnoses:  Acute bacterial sinusitis     Discharge Instructions      You have a bacterial sinus infection.  Take the antibiotics as prescribed.   Some things that can make you feel better are: - Increased rest - Increasing fluid with water/sugar free electrolytes - Acetaminophen and ibuprofen as needed for fever/pain - Salt water gargling, chloraseptic spray and throat  lozenges for sore throat - OTC guaifenesin (Mucinex) 600 mg twice daily for congestion - Saline sinus flushes or a neti pot - Humidifying the air     ED Prescriptions     Medication Sig Dispense Auth. Provider   amoxicillin-clavulanate (AUGMENTIN) 875-125 MG tablet Take 1 tablet by mouth 2 (two) times daily for 7 days. 14 tablet Eulogio Bear, NP      PDMP not reviewed this encounter.   Eulogio Bear, NP 01/09/23 1554

## 2023-01-09 NOTE — ED Triage Notes (Signed)
Pt reports fever, chills, headache, nasal congestion since last night. Reports coworkers have had something similar.

## 2023-01-09 NOTE — Discharge Instructions (Addendum)
You have a bacterial sinus infection.  Take the antibiotics as prescribed.   Some things that can make you feel better are: - Increased rest - Increasing fluid with water/sugar free electrolytes - Acetaminophen and ibuprofen as needed for fever/pain - Salt water gargling, chloraseptic spray and throat lozenges for sore throat - OTC guaifenesin (Mucinex) 600 mg twice daily for congestion - Saline sinus flushes or a neti pot - Humidifying the air

## 2024-08-11 ENCOUNTER — Ambulatory Visit
Admission: RE | Admit: 2024-08-11 | Discharge: 2024-08-11 | Disposition: A | Discharge status: Home / Self Care | Attending: Family Medicine | Admitting: Family Medicine

## 2024-08-11 VITALS — BP 131/80 | HR 67 | Temp 98.5°F | Resp 20

## 2024-08-11 DIAGNOSIS — S29012A Strain of muscle and tendon of back wall of thorax, initial encounter: Secondary | ICD-10-CM | POA: Diagnosis not present

## 2024-08-11 DIAGNOSIS — R10A1 Flank pain, right side: Secondary | ICD-10-CM

## 2024-08-11 LAB — POCT URINE DIPSTICK
Bilirubin, UA: NEGATIVE
Blood, UA: NEGATIVE
Glucose, UA: NEGATIVE mg/dL
Ketones, POC UA: NEGATIVE mg/dL
Leukocytes, UA: NEGATIVE
Nitrite, UA: NEGATIVE
POC PROTEIN,UA: NEGATIVE
Spec Grav, UA: 1.025 (ref 1.010–1.025)
Urobilinogen, UA: 0.2 U/dL
pH, UA: 7 (ref 5.0–8.0)

## 2024-08-11 MED ORDER — TIZANIDINE HCL 4 MG PO CAPS: 4.0000 mg | ORAL_CAPSULE | Freq: Three times a day (TID) | ORAL | 0 refills | Status: AC | PRN

## 2024-08-11 MED ORDER — NAPROXEN 500 MG PO TABS: 500.0000 mg | ORAL_TABLET | Freq: Two times a day (BID) | ORAL | 0 refills | Status: AC | PRN

## 2024-08-11 NOTE — ED Provider Notes (Signed)
 [facility]    CSN: [id number] Arrival date & time: 08/11/24  1246      History   Chief Complaint Chief Complaint  Patient presents with   Back Pain    Severe lower back pain in right side - Entered by patient    HPI Jeremy Robertson is a 23 y.o. male.   Patient presenting today with right mid to lower back pain progressively worsening over the past week.  States he does a lot of heavy lifting at work and feels this has made the symptoms worse.  Pain is worse with movement.  Denies radiation of pain down legs, weakness, numbness, tingling, saddle anesthesia, bowel or bladder incontinence.  So far trying ibuprofen  and heat with mild temporary benefit.    Past Medical History:  Diagnosis Date   IBS (irritable bowel syndrome)    Migraines     There are no active problems to display for this patient.   History reviewed. No pertinent surgical history.     Home Medications    Prior to Admission medications   Medication Sig Start Date End Date Taking? Authorizing Provider  naproxen (NAPROSYN) 500 MG tablet Take 1 tablet (500 mg total) by mouth 2 (two) times daily as needed. 08/11/24  Yes Stuart Vernell Norris, PA-C  tiZANidine (ZANAFLEX) 4 MG capsule Take 1 capsule (4 mg total) by mouth 3 (three) times daily as needed for muscle spasms. Do not drink alcohol or drive while taking this medication.  May cause drowsiness. 08/11/24  Yes Stuart Vernell Norris, PA-C  dicyclomine (BENTYL) 10 MG capsule Take 10 mg by mouth 4 (four) times daily -  before meals and at bedtime.    [provider]  fluticasone  (FLONASE ) 50 MCG/ACT nasal spray Place 1 spray into both nostrils daily for 14 days. 10/06/20 10/20/20  Avegno, Komlanvi S, FNP  topiramate (TOPAMAX) 25 MG tablet Take 25 mg by mouth 2 (two) times daily. 02/24/22   [provider]    Family History History reviewed. No pertinent family history.  Social History Social History   Tobacco Use    Smoking status: Never   Smokeless tobacco: Never  Vaping Use   Vaping status: Every Day  Substance Use Topics   Alcohol use: Yes    Comment: rare   Drug use: No     Allergies   Patient has no known allergies.   Review of Systems Review of Systems PER HPI  Physical Exam Triage Vital Signs ED Triage Vitals  Encounter Vitals Group     BP 08/11/24 1303 131/80     Girls Systolic BP Percentile --      Girls Diastolic BP Percentile --      Boys Systolic BP Percentile --      Boys Diastolic BP Percentile --      Pulse Rate 08/11/24 1303 67     Resp 08/11/24 1303 20     Temp 08/11/24 1303 98.5 F (36.9 C)     Temp Source 08/11/24 1303 Oral     SpO2 08/11/24 1303 98 %     Weight --      Height --      Head Circumference --      Peak Flow --      Pain Score 08/11/24 1305 9     Pain Loc --      Pain Education --      Exclude from Growth Chart --    No data found.  Updated  Vital Signs BP 131/80 (BP Location: Right Arm)   Pulse 67   Temp 98.5 F (36.9 C) (Oral)   Resp 20   SpO2 98%   Visual Acuity Right Eye Distance:   Left Eye Distance:   Bilateral Distance:    Right Eye Near:   Left Eye Near:    Bilateral Near:     Physical Exam Vitals and nursing note reviewed.  Constitutional:      Appearance: Normal appearance.  HENT:     Head: Atraumatic.     Mouth/Throat:     Mouth: Mucous membranes are moist.  Eyes:     Extraocular Movements: Extraocular movements intact.     Conjunctiva/sclera: Conjunctivae normal.  Cardiovascular:     Rate and Rhythm: Normal rate.  Pulmonary:     Effort: Pulmonary effort is normal.  Musculoskeletal:        General: Tenderness present. No swelling or signs of injury. Normal range of motion.     Cervical back: Normal range of motion and neck supple.     Comments: Right thoracic musculature tender to palpation without bony abnormality.  No midline spinal tenderness to palpation diffusely.  Negative straight leg raise bilateral  lower extremities.  Normal gait and range of motion  Skin:    General: Skin is warm and dry.  Neurological:     Mental Status: He is oriented to person, place, and time.     Comments: Bilateral lower extremities neurovascularly intact  Psychiatric:        Mood and Affect: Mood normal.        Thought Content: Thought content normal.        Judgment: Judgment normal.    UC Treatments / Results  Labs (all labs ordered are listed, but only abnormal results are displayed) Labs Reviewed  POCT URINE DIPSTICK    EKG   Radiology No results found.  Procedures Procedures (including critical care time)  Medications Ordered in UC Medications - No data to display  Initial Impression / Assessment and Plan / UC Course  I have reviewed the triage vital signs and the nursing notes.  Pertinent labs & imaging results that were available during my care of the patient were reviewed by me and considered in my medical decision making (see chart for details).     Vital signs and exam reassuring, urinalysis without any evidence of kidney stone, urinary tract issue.  Suspect thoracic strain.  Treat with Zanaflex, naproxen, supportive over-the-counter medications and home care.  Work note given.  Return for worsening symptoms.  Final Clinical Impressions(s) / UC Diagnoses   Final diagnoses:  Right flank pain  Strain of thoracic back region   Discharge Instructions   None    ED Prescriptions     Medication Sig Dispense Auth. Provider   tiZANidine (ZANAFLEX) 4 MG capsule Take 1 capsule (4 mg total) by mouth 3 (three) times daily as needed for muscle spasms. Do not drink alcohol or drive while taking this medication.  May cause drowsiness. 15 capsule Stuart Vernell Norris, PA-C   naproxen (NAPROSYN) 500 MG tablet Take 1 tablet (500 mg total) by mouth 2 (two) times daily as needed. 20 tablet Stuart Vernell Norris, [place]      PDMP not reviewed this encounter.   Stuart Vernell Norris,  [place] 08/11/24 1446

## 2024-08-11 NOTE — ED Triage Notes (Signed)
 Pt reports lower right back pain, intermittently. This past weekend pain has become sharp. Pt states he has been using the bathroom more frequently.
